# Patient Record
Sex: Male | Born: 2000 | Race: Black or African American | Hispanic: No | Marital: Single | Smoking: Never smoker
Health system: Southern US, Community
[De-identification: ages and names within clinical notes are randomized; demographics above are authoritative.]

## PROBLEM LIST (undated history)

## (undated) DIAGNOSIS — J45909 Unspecified asthma, uncomplicated: Secondary | ICD-10-CM

## (undated) DIAGNOSIS — F909 Attention-deficit hyperactivity disorder, unspecified type: Secondary | ICD-10-CM

## (undated) DIAGNOSIS — J40 Bronchitis, not specified as acute or chronic: Secondary | ICD-10-CM

## (undated) HISTORY — DX: Unspecified asthma, uncomplicated: J45.909

## (undated) HISTORY — PX: ADENOIDECTOMY: SUR15

## (undated) HISTORY — PX: TONSILLECTOMY: SUR1361

---
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
## 2023-07-17 ENCOUNTER — Other Ambulatory Visit: Payer: Self-pay

## 2023-07-17 ENCOUNTER — Emergency Department (HOSPITAL_BASED_OUTPATIENT_CLINIC_OR_DEPARTMENT_OTHER)
Admission: EM | Admit: 2023-07-17 | Discharge: 2023-07-17 | Disposition: A | Discharge status: Home / Self Care | Payer: 59 | Attending: Emergency Medicine | Admitting: Emergency Medicine

## 2023-07-17 ENCOUNTER — Encounter (HOSPITAL_BASED_OUTPATIENT_CLINIC_OR_DEPARTMENT_OTHER): Payer: Self-pay

## 2023-07-17 ENCOUNTER — Emergency Department (HOSPITAL_BASED_OUTPATIENT_CLINIC_OR_DEPARTMENT_OTHER): Payer: 59

## 2023-07-17 DIAGNOSIS — S92002B Unspecified fracture of left calcaneus, initial encounter for open fracture: Secondary | ICD-10-CM

## 2023-07-17 DIAGNOSIS — W3400XA Accidental discharge from unspecified firearms or gun, initial encounter: Secondary | ICD-10-CM

## 2023-07-17 DIAGNOSIS — Y9355 Activity, bike riding: Secondary | ICD-10-CM | POA: Diagnosis not present

## 2023-07-17 DIAGNOSIS — S92015B Nondisplaced fracture of body of left calcaneus, initial encounter for open fracture: Secondary | ICD-10-CM | POA: Diagnosis not present

## 2023-07-17 DIAGNOSIS — Z23 Encounter for immunization: Secondary | ICD-10-CM | POA: Diagnosis not present

## 2023-07-17 DIAGNOSIS — S91332A Puncture wound without foreign body, left foot, initial encounter: Secondary | ICD-10-CM | POA: Diagnosis not present

## 2023-07-17 DIAGNOSIS — R000 Tachycardia, unspecified: Secondary | ICD-10-CM | POA: Diagnosis not present

## 2023-07-17 DIAGNOSIS — S99922A Unspecified injury of left foot, initial encounter: Secondary | ICD-10-CM | POA: Diagnosis present

## 2023-07-17 LAB — BASIC METABOLIC PANEL
Anion gap: 15 (ref 5–15)
BUN: 17 mg/dL (ref 6–20)
CO2: 20 mmol/L — ABNORMAL LOW (ref 22–32)
Calcium: 8.9 mg/dL (ref 8.9–10.3)
Chloride: 104 mmol/L (ref 98–111)
Creatinine, Ser: 1.38 mg/dL — ABNORMAL HIGH (ref 0.61–1.24)
GFR, Estimated: 34 mL/min — ABNORMAL LOW (ref >60)
Glucose, Bld: 114 mg/dL — ABNORMAL HIGH (ref 70–99)
Potassium: 3.6 mmol/L (ref 3.5–5.1)
Sodium: 139 mmol/L (ref 135–145)

## 2023-07-17 LAB — CBC WITH DIFFERENTIAL/PLATELET
Abs Immature Granulocytes: 0.02 10*3/uL (ref 0.00–0.07)
Basophils Absolute: 0 10*3/uL (ref 0.0–0.1)
Basophils Relative: 1 %
Eosinophils Absolute: 0.1 10*3/uL (ref 0.0–0.5)
Eosinophils Relative: 2 %
HCT: 46.2 % (ref 39.0–52.0)
Hemoglobin: 15.4 g/dL (ref 13.0–17.0)
Immature Granulocytes: 0 %
Lymphocytes Relative: 50 %
Lymphs Abs: 4.4 10*3/uL — ABNORMAL HIGH (ref 0.7–4.0)
MCH: 30 pg (ref 26.0–34.0)
MCHC: 33.3 g/dL (ref 30.0–36.0)
MCV: 89.9 fL (ref 80.0–100.0)
Monocytes Absolute: 0.8 10*3/uL (ref 0.1–1.0)
Monocytes Relative: 10 %
Neutro Abs: 3.2 10*3/uL (ref 1.7–7.7)
Neutrophils Relative %: 37 %
Platelets: 244 10*3/uL (ref 150–400)
RBC: 5.14 MIL/uL (ref 4.22–5.81)
RDW: 12.9 % (ref 11.5–15.5)
WBC: 8.6 10*3/uL (ref 4.0–10.5)
nRBC: 0 % (ref 0.0–0.2)

## 2023-07-17 MED ORDER — HYDROMORPHONE HCL 1 MG/ML IJ SOLN
1.0000 mg | Freq: Once | INTRAMUSCULAR | Status: AC
  Administered 2023-07-17: 1 mg via INTRAVENOUS
  Filled 2023-07-17: qty 1

## 2023-07-17 MED ORDER — OXYCODONE-ACETAMINOPHEN 5-325 MG PO TABS: 1.0000 | ORAL_TABLET | Freq: Four times a day (QID) | ORAL | 0 refills | Status: AC | PRN

## 2023-07-17 MED ORDER — CEFAZOLIN SODIUM-DEXTROSE 2-4 GM/100ML-% IV SOLN
2.0000 g | Freq: Once | INTRAVENOUS | Status: AC
  Administered 2023-07-17: 2 g via INTRAVENOUS
  Filled 2023-07-17: qty 100

## 2023-07-17 MED ORDER — FENTANYL CITRATE PF 50 MCG/ML IJ SOSY
50.0000 ug | PREFILLED_SYRINGE | Freq: Once | INTRAMUSCULAR | Status: AC
  Administered 2023-07-17: 50 ug via INTRAVENOUS
  Filled 2023-07-17: qty 1

## 2023-07-17 MED ORDER — TETANUS-DIPHTH-ACELL PERTUSSIS 5-2.5-18.5 LF-MCG/0.5 IM SUSY
0.5000 mL | PREFILLED_SYRINGE | Freq: Once | INTRAMUSCULAR | Status: AC
  Administered 2023-07-17: 0.5 mL via INTRAMUSCULAR
  Filled 2023-07-17: qty 0.5

## 2023-07-17 MED ORDER — SODIUM CHLORIDE 0.9 % IV SOLN: Freq: Once | INTRAVENOUS | Status: AC

## 2023-07-17 MED ORDER — CEPHALEXIN 500 MG PO CAPS: 500.0000 mg | ORAL_CAPSULE | Freq: Two times a day (BID) | ORAL | 0 refills | Status: AC

## 2023-07-17 MED ORDER — FENTANYL CITRATE PF 50 MCG/ML IJ SOSY
50.0000 ug | PREFILLED_SYRINGE | INTRAMUSCULAR | Status: DC | PRN
  Administered 2023-07-17 (×2): 50 ug via INTRAVENOUS
  Filled 2023-07-17 (×2): qty 1

## 2023-07-17 NOTE — ED Notes (Signed)
Applied dressing to left foot consisting of Xeroform gauze, non-adherent pad, abdominal pad, rolled gauze, secured with tape. Patient tolerated well.
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
## 2023-07-17 NOTE — Discharge Instructions (Addendum)
You have a break in your heel bone.  Do not put any weight on your foot.  Keep your leg elevated.  Please call to follow-up closely with the orthopedic surgeon.  Get rechecked if you have severe pain, fevers or new concerning symptoms.  Do not get your splint wet.

## 2023-07-17 NOTE — ED Notes (Signed)
Pt returned from CT at this time.  

## 2023-07-17 NOTE — ED Notes (Signed)
Reviewed AVS with patient and mother, patient expressed understanding of directions, denies further questions at this time.
